# Patient Record
Sex: Male | Born: 1980 | Race: Black or African American | Hispanic: No | Marital: Single | Smoking: Current every day smoker
Health system: Southern US, Community
[De-identification: ages and names within clinical notes are randomized; demographics above are authoritative.]

## PROBLEM LIST (undated history)

## (undated) DIAGNOSIS — T148XXA Other injury of unspecified body region, initial encounter: Secondary | ICD-10-CM

## (undated) DIAGNOSIS — I639 Cerebral infarction, unspecified: Secondary | ICD-10-CM

## (undated) HISTORY — PX: SP PERC PLACE GASTRIC TUBE: HXRAD333

---

## 2012-01-19 ENCOUNTER — Encounter (HOSPITAL_BASED_OUTPATIENT_CLINIC_OR_DEPARTMENT_OTHER): Payer: Self-pay | Admitting: Family Medicine

## 2012-01-19 ENCOUNTER — Emergency Department (HOSPITAL_BASED_OUTPATIENT_CLINIC_OR_DEPARTMENT_OTHER)
Admission: EM | Admit: 2012-01-19 | Discharge: 2012-01-19 | Disposition: A | Discharge status: Home / Self Care | Payer: Self-pay | Attending: Emergency Medicine | Admitting: Emergency Medicine

## 2012-01-19 DIAGNOSIS — L309 Dermatitis, unspecified: Secondary | ICD-10-CM

## 2012-01-19 DIAGNOSIS — L259 Unspecified contact dermatitis, unspecified cause: Secondary | ICD-10-CM | POA: Insufficient documentation

## 2012-01-19 DIAGNOSIS — F17200 Nicotine dependence, unspecified, uncomplicated: Secondary | ICD-10-CM | POA: Insufficient documentation

## 2012-01-19 MED ORDER — DIPHENHYDRAMINE HCL 25 MG PO CAPS
50.0000 mg | ORAL_CAPSULE | Freq: Once | ORAL | Status: AC
  Administered 2012-01-19: 50 mg via ORAL
  Filled 2012-01-19: qty 1

## 2012-01-19 MED ORDER — DIPHENHYDRAMINE HCL 25 MG PO CAPS: 25.0000 mg | ORAL_CAPSULE | Freq: Four times a day (QID) | ORAL | Status: DC | PRN

## 2012-01-19 MED ORDER — PREDNISONE 50 MG PO TABS: 50.0000 mg | ORAL_TABLET | Freq: Every day | ORAL | Status: DC

## 2012-01-19 MED ORDER — PREDNISONE 50 MG PO TABS
60.0000 mg | ORAL_TABLET | Freq: Once | ORAL | Status: AC
  Administered 2012-01-19: 60 mg via ORAL
  Filled 2012-01-19: qty 1

## 2012-01-19 MED ORDER — DIPHENHYDRAMINE HCL 25 MG PO CAPS
ORAL_CAPSULE | ORAL | Status: AC
  Filled 2012-01-19: qty 1

## 2012-01-19 MED ORDER — CARRINGTON MOISTURE BARRIER EX CREA: TOPICAL_CREAM | CUTANEOUS | Status: DC | PRN

## 2012-01-19 NOTE — ED Notes (Signed)
As patient was getting discharged he states "I think I pulled a muscle". MD aware. Pt informed to take OTC Ibuprofen, anti inflammatory for the discomfort and follow up with his MD if no better in a few days.

## 2012-01-19 NOTE — ED Provider Notes (Signed)
History     CSN: [id number]  Arrival date & time 01/19/12  4098   First MD Initiated Contact with Patient 01/19/12 1019      Chief Complaint  Patient presents with  . Rash    (Consider location/radiation/quality/duration/timing/severity/associated sxs/prior treatment) HPI Comments: Pt with no medical hx, no allergy hx, no skin disease comes in with cc of rash. Pt has had rash in his bilateral lower extremity for 1 month now. The rash is not spreading, but is itchy - at all times. No bleeding from the site. No new exposures to environmental allergens, or chemicals.   Patient is a 31 y.o. male presenting with rash. The history is provided by the patient.  Rash     History reviewed. No pertinent past medical history.  History reviewed. No pertinent past surgical history.  No family history on file.  History  Substance Use Topics  . Smoking status: Current Every Day Smoker  . Smokeless tobacco: Not on file  . Alcohol Use: Yes      Review of Systems  Constitutional: Negative for activity change and appetite change.  Respiratory: Negative for cough and shortness of breath.   Cardiovascular: Negative for chest pain.  Gastrointestinal: Negative for abdominal pain.  Genitourinary: Negative for dysuria.  Skin: Positive for rash.    Allergies  Review of patient's allergies indicates no known allergies.  Home Medications  No current outpatient prescriptions on file.  BP 102/71  Pulse 68  Temp 97.9 F (36.6 C) (Oral)  Resp 14  Ht 5\' 11"  (1.803 m)  Wt 150 lb (68.04 kg)  BMI 20.92 kg/m2  SpO2 100%  Physical Exam  Nursing note and vitals reviewed. Constitutional: He is oriented to person, place, and time. He appears well-developed.  HENT:  Head: Normocephalic and atraumatic.  Eyes: Conjunctivae normal and EOM are normal. Pupils are equal, round, and reactive to light.  Neck: Normal range of motion. Neck supple.  Cardiovascular: Normal rate and regular rhythm.    Pulmonary/Chest: Effort normal and breath sounds normal.  Abdominal: Soft. Bowel sounds are normal. He exhibits no distension. There is no tenderness. There is no rebound and no guarding.  Neurological: He is alert and oriented to person, place, and time.  Skin: Skin is warm. Rash noted.       Pt has hyperpigmented, scaly plaques diffusely in the lower extremity, worst over the right thigh region. There is no erythema, no fluctuance, no tenderness.     ED Course  Procedures (including critical care time)  Labs Reviewed - No data to display No results found.   No diagnosis found.    MDM  Pt comes in with cc of rash. The exam is not consistent with a specific etiology. No signs of infection. No family hx or personal hx of autoimmune disease or skin disease. Will treat the symptoms, and request pcp follow up.  Derwood Kaplan, MD 01/19/12 1110

## 2012-01-19 NOTE — ED Notes (Signed)
Pt c/o "rash" to right upper leg x "about a month" and itches.

## 2012-07-18 ENCOUNTER — Encounter (HOSPITAL_BASED_OUTPATIENT_CLINIC_OR_DEPARTMENT_OTHER): Payer: Self-pay | Admitting: Emergency Medicine

## 2012-07-18 ENCOUNTER — Emergency Department (HOSPITAL_BASED_OUTPATIENT_CLINIC_OR_DEPARTMENT_OTHER)
Admission: EM | Admit: 2012-07-18 | Discharge: 2012-07-18 | Disposition: A | Discharge status: Home / Self Care | Payer: BC Managed Care – PPO | Attending: Emergency Medicine | Admitting: Emergency Medicine

## 2012-07-18 DIAGNOSIS — L0291 Cutaneous abscess, unspecified: Secondary | ICD-10-CM

## 2012-07-18 DIAGNOSIS — L0231 Cutaneous abscess of buttock: Secondary | ICD-10-CM | POA: Insufficient documentation

## 2012-07-18 DIAGNOSIS — F17200 Nicotine dependence, unspecified, uncomplicated: Secondary | ICD-10-CM | POA: Insufficient documentation

## 2012-07-18 MED ORDER — LIDOCAINE HCL 2 % IJ SOLN
10.0000 mL | Freq: Once | INTRAMUSCULAR | Status: AC
  Administered 2012-07-18: 200 mg
  Filled 2012-07-18: qty 20

## 2012-07-18 MED ORDER — SULFAMETHOXAZOLE-TRIMETHOPRIM 800-160 MG PO TABS: 2.0000 | ORAL_TABLET | Freq: Two times a day (BID) | ORAL | Status: DC

## 2012-07-18 MED ORDER — HYDROCODONE-ACETAMINOPHEN 5-325 MG PO TABS
2.0000 | ORAL_TABLET | ORAL | Status: DC | PRN
Start: 2012-07-18 — End: 2013-12-09

## 2012-07-18 NOTE — ED Notes (Signed)
Pt has abscess/boil to left buttock x 2 days.  Unable to sit down.  No known fever.

## 2012-07-18 NOTE — ED Provider Notes (Signed)
History     CSN: [id number]  Arrival date & time 07/18/12  2000   First MD Initiated Contact with Patient 07/18/12 2014      Chief Complaint  Patient presents with  . Abscess    (Consider location/radiation/quality/duration/timing/severity/associated sxs/prior treatment) HPI Comments: Patient goes to [place] for evaluation of an abscess on his backside. Patient reports that it started as a Volner bump on his left buttock, over the last 2 days it has progressively worsened. Pain is now unbearable. He can't sit because of the pain. Patient also has an area on his right upper buttock that has been there for longer period of time. It has started draining.  Patient is a 32 y.o. male presenting with abscess.  Abscess Associated symptoms: no fever     No past medical history on file.  No past surgical history on file.  No family history on file.  History  Substance Use Topics  . Smoking status: Current Every Day Smoker  . Smokeless tobacco: Not on file  . Alcohol Use: Yes      Review of Systems  Constitutional: Negative for fever.  Skin: Positive for wound.    Allergies  Review of patient's allergies indicates no known allergies.  Home Medications  No current outpatient prescriptions on file.  BP 130/76  Pulse 103  Temp(Src) 99.8 F (37.7 C) (Oral)  Resp 16  Ht 6' (1.829 m)  Wt 147 lb (66.679 kg)  BMI 19.93 kg/m2  SpO2 100%  Physical Exam  Constitutional: He appears well-developed.  HENT:  Head: Normocephalic.  Eyes: Pupils are equal, round, and reactive to light.  Cardiovascular: Normal rate.   Pulmonary/Chest: Effort normal and breath sounds normal.  Musculoskeletal: Normal range of motion. He exhibits no edema.  Skin:       ED Course  INCISION AND DRAINAGE Date/Time: 07/18/2012 9:03 PM Performed by: Gilda Crease. Authorized by: Gilda Crease Consent: Verbal consent obtained. Risks and benefits: risks, benefits and alternatives were  discussed Consent given by: patient Patient understanding: patient states understanding of the procedure being performed Type: abscess Body area: anogenital Location details: gluteal cleft Anesthesia: local infiltration Local anesthetic: lidocaine 2% without epinephrine Anesthetic total: 5 ml Patient sedated: no Scalpel size: 11 Incision type: single straight Complexity: simple Drainage: purulent Drainage amount: copious Packing material: 1/4 in iodoform gauze Patient tolerance: Patient tolerated the procedure well with no immediate complications.   (including critical care time)    Labs Reviewed - No data to display No results found.   Diagnosis: Skin abscess    MDM  Patient has a spontaneously draining abscess in his right upper gluteal region which did not require any intervention. He had a second abscess, however, at the gluteal cleft on the left buttock region. This required incision and drainage. Procedure was performed without difficulty. Packing left in place. Patient will return in 2 days for packing removal and repeat evaluation. The patient was treated with Vicodin and Bactrim.        Gilda Crease, MD 07/18/12 2105

## 2012-07-20 ENCOUNTER — Emergency Department (HOSPITAL_BASED_OUTPATIENT_CLINIC_OR_DEPARTMENT_OTHER)
Admission: EM | Admit: 2012-07-20 | Discharge: 2012-07-20 | Disposition: A | Discharge status: Home / Self Care | Payer: BC Managed Care – PPO | Attending: Emergency Medicine | Admitting: Emergency Medicine

## 2012-07-20 ENCOUNTER — Encounter (HOSPITAL_BASED_OUTPATIENT_CLINIC_OR_DEPARTMENT_OTHER): Payer: Self-pay | Admitting: *Deleted

## 2012-07-20 DIAGNOSIS — IMO0001 Reserved for inherently not codable concepts without codable children: Secondary | ICD-10-CM | POA: Insufficient documentation

## 2012-07-20 DIAGNOSIS — F17200 Nicotine dependence, unspecified, uncomplicated: Secondary | ICD-10-CM | POA: Insufficient documentation

## 2012-07-20 DIAGNOSIS — Z4801 Encounter for change or removal of surgical wound dressing: Secondary | ICD-10-CM | POA: Insufficient documentation

## 2012-07-20 MED ORDER — SULFAMETHOXAZOLE-TRIMETHOPRIM 800-160 MG PO TABS: 1.0000 | ORAL_TABLET | Freq: Two times a day (BID) | ORAL | Status: DC

## 2012-07-20 NOTE — ED Provider Notes (Signed)
History     CSN: [id number]  Arrival date & time 07/20/12  1216   First MD Initiated Contact with Patient 07/20/12 1321      Chief Complaint  Patient presents with  . Wound Check    (Consider location/radiation/quality/duration/timing/severity/associated sxs/prior treatment) Patient is a 32 y.o. male presenting with wound check. The history is provided by the patient. No language interpreter was used.  Wound Check This is a new problem. The current episode started today. The problem occurs constantly. The problem has been gradually worsening. Nothing aggravates the symptoms. He has tried nothing for the symptoms. The treatment provided moderate relief.    History reviewed. No pertinent past medical history.  History reviewed. No pertinent past surgical history.  No family history on file.  History  Substance Use Topics  . Smoking status: Current Every Day Smoker  . Smokeless tobacco: Not on file  . Alcohol Use: Yes      Review of Systems  Skin: Positive for wound.  All other systems reviewed and are negative.    Allergies  Review of patient's allergies indicates no known allergies.  Home Medications   Current Outpatient Rx  Name  Route  Sig  Dispense  Refill  . HYDROcodone-acetaminophen (NORCO/VICODIN) 5-325 MG per tablet   Oral   Take 2 tablets by mouth every 4 (four) hours as needed for pain.   10 tablet   0   . sulfamethoxazole-trimethoprim (SEPTRA DS) 800-160 MG per tablet   Oral   Take 1 tablet by mouth 2 (two) times daily.   20 tablet   0     BP 122/74  Pulse 75  Temp(Src) 98.9 F (37.2 C) (Oral)  Resp 20  Ht 6' (1.829 m)  Wt 140 lb (63.504 kg)  BMI 18.98 kg/m2  SpO2 100%  Physical Exam  Vitals reviewed. Constitutional: He is oriented to person, place, and time. He appears well-developed and well-nourished.  Musculoskeletal: He exhibits tenderness.  Packing removed  Neurological: He is alert and oriented to person, place, and time.   Skin: Skin is warm.  Psychiatric: He has a normal mood and affect.    ED Course  Procedures (including critical care time)  Labs Reviewed - No data to display No results found.   1. Wound abscess, subsequent encounter       MDM  Pt has not filled RX.   Pt advised to take antibiotic,   Packing removed        Elson Areas, PA-C 07/20/12 [address] [place], [place] 07/20/12 1402

## 2012-07-20 NOTE — ED Provider Notes (Signed)
Medical screening examination/treatment/procedure(s) were performed by non-physician practitioner and as supervising physician I was immediately available for consultation/collaboration.   Charles B. Sheldon, MD 07/20/12 1446 
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
## 2013-12-09 ENCOUNTER — Encounter (HOSPITAL_BASED_OUTPATIENT_CLINIC_OR_DEPARTMENT_OTHER): Payer: Self-pay | Admitting: *Deleted

## 2013-12-09 ENCOUNTER — Emergency Department (HOSPITAL_BASED_OUTPATIENT_CLINIC_OR_DEPARTMENT_OTHER)
Admission: EM | Admit: 2013-12-09 | Discharge: 2013-12-09 | Disposition: A | Discharge status: Home / Self Care | Payer: BC Managed Care – PPO | Attending: Emergency Medicine | Admitting: Emergency Medicine

## 2013-12-09 DIAGNOSIS — G8929 Other chronic pain: Secondary | ICD-10-CM | POA: Diagnosis not present

## 2013-12-09 DIAGNOSIS — Z87828 Personal history of other (healed) physical injury and trauma: Secondary | ICD-10-CM | POA: Diagnosis not present

## 2013-12-09 DIAGNOSIS — Z720 Tobacco use: Secondary | ICD-10-CM | POA: Diagnosis not present

## 2013-12-09 DIAGNOSIS — Z792 Long term (current) use of antibiotics: Secondary | ICD-10-CM | POA: Insufficient documentation

## 2013-12-09 DIAGNOSIS — Z8673 Personal history of transient ischemic attack (TIA), and cerebral infarction without residual deficits: Secondary | ICD-10-CM | POA: Diagnosis not present

## 2013-12-09 DIAGNOSIS — M545 Low back pain, unspecified: Secondary | ICD-10-CM

## 2013-12-09 DIAGNOSIS — Z993 Dependence on wheelchair: Secondary | ICD-10-CM | POA: Diagnosis not present

## 2013-12-09 HISTORY — DX: Other injury of unspecified body region, initial encounter: T14.8XXA

## 2013-12-09 HISTORY — DX: Cerebral infarction, unspecified: I63.9

## 2013-12-09 MED ORDER — OXYCODONE-ACETAMINOPHEN 5-325 MG PO TABS: 1.0000 | ORAL_TABLET | ORAL | Status: DC | PRN
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
## 2013-12-09 NOTE — Discharge Instructions (Signed)
As discussed you need to follow up with Pain management if you continue to have chronic pain in your back Chronic Back Pain  When back pain lasts longer than 3 months, it is called chronic back pain.People with chronic back pain often go through certain periods that are more intense (flare-ups).  CAUSES Chronic back pain can be caused by wear and tear (degeneration) on different structures in your back. These structures include:  The bones of your spine (vertebrae) and the joints surrounding your spinal cord and nerve roots (facets).  The strong, fibrous tissues that connect your vertebrae (ligaments). Degeneration of these structures may result in pressure on your nerves. This can lead to constant pain. HOME CARE INSTRUCTIONS  Avoid bending, heavy lifting, prolonged sitting, and activities which make the problem worse.  Take brief periods of rest throughout the day to reduce your pain. Lying down or standing usually is better than sitting while you are resting.  Take over-the-counter or prescription medicines only as directed by your caregiver. SEEK IMMEDIATE MEDICAL CARE IF:   You have weakness or numbness in one of your legs or feet.  You have trouble controlling your bladder or bowels.  You have nausea, vomiting, abdominal pain, shortness of breath, or fainting. Document Released: 02/25/2004 Document Revised: 04/11/2011 Document Reviewed: 01/01/2011 [facility] Patient Information 2015 [place], [place]. This information is not intended to replace advice given to you by your health care provider. Make sure you discuss any questions you have with your health care provider.
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
## 2018-11-06 ENCOUNTER — Emergency Department (HOSPITAL_BASED_OUTPATIENT_CLINIC_OR_DEPARTMENT_OTHER): Payer: Medicaid Other

## 2018-11-06 ENCOUNTER — Other Ambulatory Visit: Payer: Self-pay

## 2018-11-06 ENCOUNTER — Encounter (HOSPITAL_BASED_OUTPATIENT_CLINIC_OR_DEPARTMENT_OTHER): Payer: Self-pay | Admitting: Emergency Medicine

## 2018-11-06 ENCOUNTER — Emergency Department (HOSPITAL_BASED_OUTPATIENT_CLINIC_OR_DEPARTMENT_OTHER)
Admission: EM | Admit: 2018-11-06 | Discharge: 2018-11-06 | Disposition: A | Discharge status: Home / Self Care | Payer: Medicaid Other | Attending: Emergency Medicine | Admitting: Emergency Medicine

## 2018-11-06 DIAGNOSIS — F17210 Nicotine dependence, cigarettes, uncomplicated: Secondary | ICD-10-CM | POA: Diagnosis not present

## 2018-11-06 DIAGNOSIS — R0981 Nasal congestion: Secondary | ICD-10-CM | POA: Insufficient documentation

## 2018-11-06 DIAGNOSIS — R519 Headache, unspecified: Secondary | ICD-10-CM | POA: Diagnosis present

## 2018-11-06 MED ORDER — IBUPROFEN 800 MG PO TABS: 800.0000 mg | ORAL_TABLET | Freq: Three times a day (TID) | ORAL | 0 refills | Status: AC

## 2018-11-06 MED ORDER — FLUTICASONE PROPIONATE 50 MCG/ACT NA SUSP: 2.0000 | Freq: Every day | NASAL | 1 refills | Status: AC

## 2018-11-06 NOTE — ED Notes (Signed)
Patient transported to CT 

## 2018-11-06 NOTE — ED Notes (Signed)
Pt denies pain- reports "pressure" is 7/10.

## 2018-11-06 NOTE — ED Triage Notes (Signed)
Generalized head pressure x 3 days. Denies pain.
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
## 2018-11-06 NOTE — Discharge Instructions (Addendum)
Follow-up with [facility] if the symptoms continue to persist in 1 to 2 weeks.  Use Flonase as we discussed.  Also encourage you to use prescribed ibuprofen instead of the Goody powders.  Return to the ER C medical attention if you develop any new or worsening neurologic deficits, chest pain, worsening of his life, shortness of breath, or other new or worsening symptoms.

## 2020-05-05 ENCOUNTER — Ambulatory Visit (INDEPENDENT_AMBULATORY_CARE_PROVIDER_SITE_OTHER): Payer: Medicaid Other | Admitting: Primary Care

## 2020-07-20 ENCOUNTER — Ambulatory Visit (INDEPENDENT_AMBULATORY_CARE_PROVIDER_SITE_OTHER): Payer: Medicaid Other | Admitting: Primary Care

## 2020-09-28 ENCOUNTER — Ambulatory Visit (INDEPENDENT_AMBULATORY_CARE_PROVIDER_SITE_OTHER): Payer: Medicaid Other | Admitting: Primary Care

## 2021-04-16 ENCOUNTER — Encounter (HOSPITAL_COMMUNITY): Payer: Self-pay

## 2021-04-16 ENCOUNTER — Other Ambulatory Visit: Payer: Self-pay

## 2021-04-16 ENCOUNTER — Emergency Department (HOSPITAL_COMMUNITY)
Admission: EM | Admit: 2021-04-16 | Discharge: 2021-04-16 | Disposition: A | Discharge status: Home / Self Care | Payer: Medicaid Other | Attending: Physician Assistant | Admitting: Physician Assistant

## 2021-04-16 ENCOUNTER — Emergency Department (HOSPITAL_COMMUNITY): Payer: Medicaid Other

## 2021-04-16 DIAGNOSIS — R519 Headache, unspecified: Secondary | ICD-10-CM | POA: Diagnosis present

## 2021-04-16 DIAGNOSIS — Z5321 Procedure and treatment not carried out due to patient leaving prior to being seen by health care provider: Secondary | ICD-10-CM | POA: Diagnosis not present

## 2021-04-16 LAB — CBC WITH DIFFERENTIAL/PLATELET
Abs Immature Granulocytes: 0.01 10*3/uL (ref 0.00–0.07)
Basophils Absolute: 0.1 10*3/uL (ref 0.0–0.1)
Basophils Relative: 1 %
Eosinophils Absolute: 0 10*3/uL (ref 0.0–0.5)
Eosinophils Relative: 1 %
HCT: 36.4 % — ABNORMAL LOW (ref 39.0–52.0)
Hemoglobin: 12.3 g/dL — ABNORMAL LOW (ref 13.0–17.0)
Immature Granulocytes: 0 %
Lymphocytes Relative: 39 %
Lymphs Abs: 1.7 10*3/uL (ref 0.7–4.0)
MCH: 25.4 pg — ABNORMAL LOW (ref 26.0–34.0)
MCHC: 33.8 g/dL (ref 30.0–36.0)
MCV: 75.2 fL — ABNORMAL LOW (ref 80.0–100.0)
Monocytes Absolute: 0.7 10*3/uL (ref 0.1–1.0)
Monocytes Relative: 15 %
Neutro Abs: 1.9 10*3/uL (ref 1.7–7.7)
Neutrophils Relative %: 44 %
Platelets: 183 10*3/uL (ref 150–400)
RBC: 4.84 MIL/uL (ref 4.22–5.81)
RDW: 13.8 % (ref 11.5–15.5)
WBC: 4.4 10*3/uL (ref 4.0–10.5)
nRBC: 0 % (ref 0.0–0.2)

## 2021-04-16 LAB — COMPREHENSIVE METABOLIC PANEL
ALT: 64 U/L — ABNORMAL HIGH (ref 0–44)
AST: 112 U/L — ABNORMAL HIGH (ref 15–41)
Albumin: 4.4 g/dL (ref 3.5–5.0)
Alkaline Phosphatase: 56 U/L (ref 38–126)
Anion gap: 12 (ref 5–15)
BUN: <5 mg/dL — ABNORMAL LOW (ref 6–20)
CO2: 26 mmol/L (ref 22–32)
Calcium: 9 mg/dL (ref 8.9–10.3)
Chloride: 95 mmol/L — ABNORMAL LOW (ref 98–111)
Creatinine, Ser: 0.7 mg/dL (ref 0.61–1.24)
GFR, Estimated: >60 mL/min (ref >60)
Glucose, Bld: 85 mg/dL (ref 70–99)
Potassium: 3.9 mmol/L (ref 3.5–5.1)
Sodium: 133 mmol/L — ABNORMAL LOW (ref 135–145)
Total Bilirubin: 0.6 mg/dL (ref 0.3–1.2)
Total Protein: 7.5 g/dL (ref 6.5–8.1)
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
## 2021-04-16 NOTE — ED Notes (Signed)
Pt refused vitals and states that he is about to leave and go to his PCP on Monday  ?

## 2021-04-16 NOTE — ED Notes (Signed)
Pt was seen leaving the ED.  ?

## 2021-04-17 ENCOUNTER — Ambulatory Visit (HOSPITAL_COMMUNITY)
Admission: EM | Admit: 2021-04-17 | Discharge: 2021-04-17 | Disposition: A | Discharge status: Home / Self Care | Payer: Medicaid Other | Attending: Urgent Care | Admitting: Urgent Care

## 2021-04-17 ENCOUNTER — Encounter (HOSPITAL_COMMUNITY): Payer: Self-pay | Admitting: *Deleted

## 2021-04-17 ENCOUNTER — Other Ambulatory Visit: Payer: Self-pay

## 2021-04-17 DIAGNOSIS — G44221 Chronic tension-type headache, intractable: Secondary | ICD-10-CM | POA: Diagnosis not present

## 2021-04-17 DIAGNOSIS — D649 Anemia, unspecified: Secondary | ICD-10-CM | POA: Diagnosis not present

## 2021-04-17 DIAGNOSIS — R7989 Other specified abnormal findings of blood chemistry: Secondary | ICD-10-CM | POA: Diagnosis not present

## 2021-04-17 DIAGNOSIS — Z8673 Personal history of transient ischemic attack (TIA), and cerebral infarction without residual deficits: Secondary | ICD-10-CM

## 2021-04-17 MED ORDER — CYCLOBENZAPRINE HCL 10 MG PO TABS: 10.0000 mg | ORAL_TABLET | Freq: Two times a day (BID) | ORAL | 0 refills | Status: AC | PRN

## 2021-04-17 MED ORDER — BUTALBITAL-APAP-CAFFEINE 50-325-40 MG PO TABS: 1.0000 | ORAL_TABLET | Freq: Four times a day (QID) | ORAL | 0 refills | Status: AC | PRN
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
## 2021-04-17 NOTE — ED Triage Notes (Signed)
Pt reports he has had a tension HA for 11 months. Pt  reports no one can find out what it is.Pt reports his head getts tight some times. ?

## 2021-04-17 NOTE — Discharge Instructions (Addendum)
Please call your PCP and schedule a follow up / workup. You have elevated liver function tests and anemia, both of which can indicate liver damage. I would recommend a workup for hepatitis, particularly Hepatitis C as headache can be seen in this condition. ?Please CUT BACK on alcohol intake, as this can also lead to headache, anemia and liver issues. Schedule follow up with PCP. ?We are treating you today for a tension headache. Please do not exceed 4 tabs of fioricet in a week. If needed, you can take cyclobenzaprine in place of it, however this medication might make you drowsy. ? ?

## 2022-05-05 ENCOUNTER — Ambulatory Visit (HOSPITAL_COMMUNITY)
Admission: EM | Admit: 2022-05-05 | Discharge: 2022-05-05 | Disposition: A | Discharge status: Home / Self Care | Payer: Medicaid Other | Attending: Internal Medicine | Admitting: Internal Medicine

## 2022-05-05 ENCOUNTER — Encounter (HOSPITAL_COMMUNITY): Payer: Self-pay | Admitting: Emergency Medicine

## 2022-05-05 ENCOUNTER — Telehealth (HOSPITAL_COMMUNITY): Payer: Self-pay | Admitting: Emergency Medicine

## 2022-05-05 DIAGNOSIS — K1379 Other lesions of oral mucosa: Secondary | ICD-10-CM | POA: Diagnosis not present

## 2022-05-05 DIAGNOSIS — R6884 Jaw pain: Secondary | ICD-10-CM

## 2022-05-05 MED ORDER — AMOXICILLIN-POT CLAVULANATE 875-125 MG PO TABS: 1.0000 | ORAL_TABLET | Freq: Two times a day (BID) | ORAL | 0 refills | Status: DC

## 2022-05-05 MED ORDER — LIDOCAINE VISCOUS HCL 2 % MT SOLN: 15.0000 mL | OROMUCOSAL | 0 refills | Status: AC | PRN

## 2022-05-05 MED ORDER — LIDOCAINE VISCOUS HCL 2 % MT SOLN: 15.0000 mL | OROMUCOSAL | 0 refills | Status: DC | PRN

## 2022-05-05 MED ORDER — PHENOL 0.5 % MT LIQD: OROMUCOSAL | 0 refills | Status: DC

## 2022-05-05 MED ORDER — PHENOL 0.5 % MT LIQD: OROMUCOSAL | 0 refills | Status: AC

## 2022-05-05 MED ORDER — AMOXICILLIN-POT CLAVULANATE 875-125 MG PO TABS: 1.0000 | ORAL_TABLET | Freq: Two times a day (BID) | ORAL | 0 refills | Status: AC

## 2022-05-05 NOTE — ED Triage Notes (Signed)
Pt reports oral pain. States he has pain around his gums. One area affected is in the front and the other is on the right side of jaw. Has tried a mouth rinse with no relief.

## 2022-05-05 NOTE — Discharge Instructions (Signed)
Please continue using oral antiseptic mouthwash Apply lidocaine to the areas involved Please take vitamin C over-the-counter tablets once daily Complete the course of antibiotics Return to urgent care if you have any other concerns.
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
## 2022-07-31 IMAGING — CT CT HEAD W/O CM
4 series · 17 of 47 positions shown, 19 images · non-contrast
Comparison: CT head dated November 06, 2018.

CLINICAL DATA: Chronic headaches.



[Series 3: head without · axial · non-contrast · 0.43mm/px · z∈[+1138,+1258]mm · 7 of 33 slices shown, 9 images]
[im 5/33  brain]
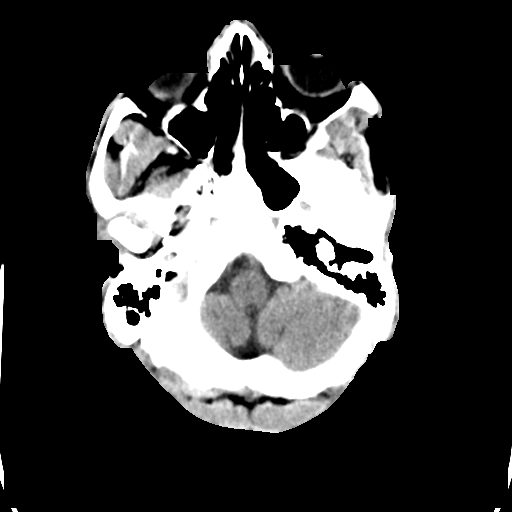
[im 5/33  bone]
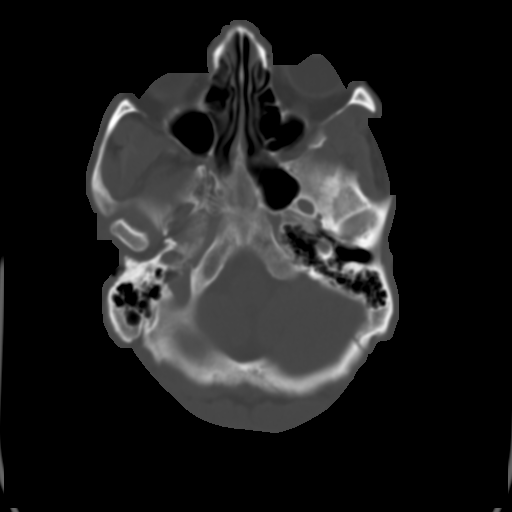
[im 9/33  brain]
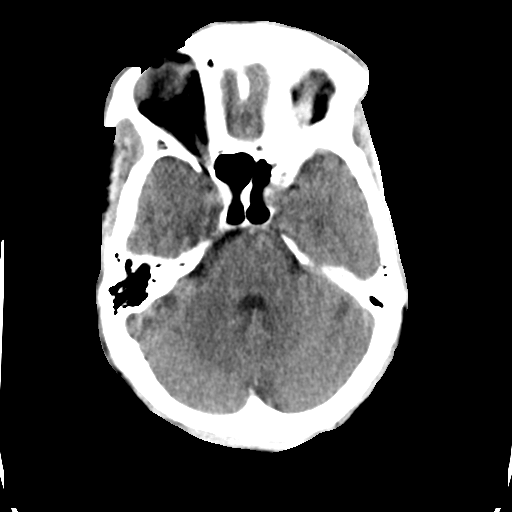
[im 13/33  brain]
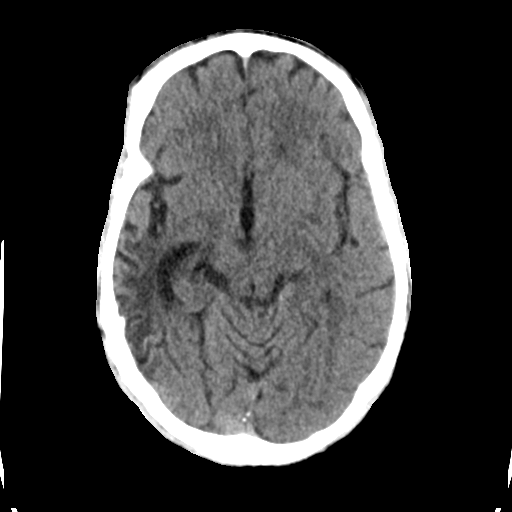
[im 17/33  brain]
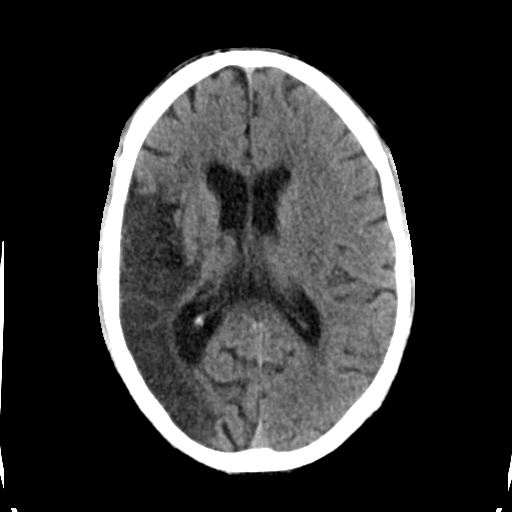
[im 21/33  brain]
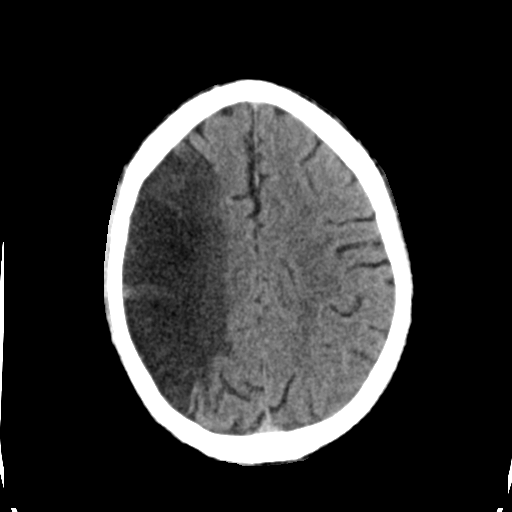
[im 21/33  bone]
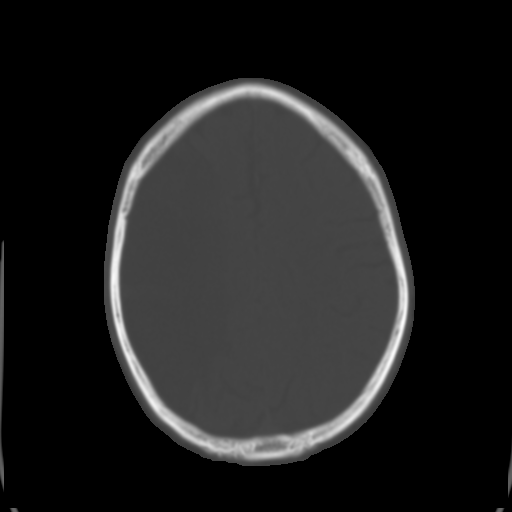
[im 25/33  brain]
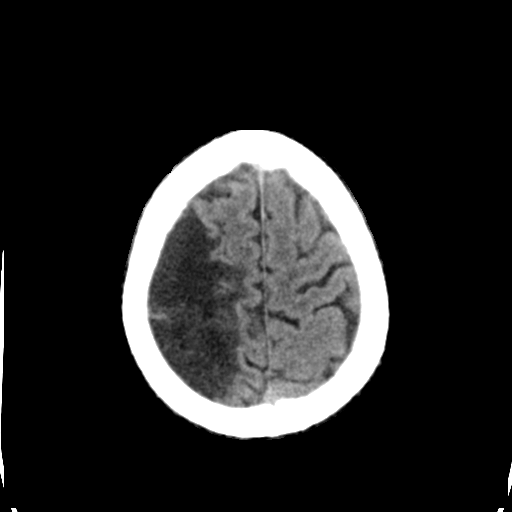
[im 29/33  brain]
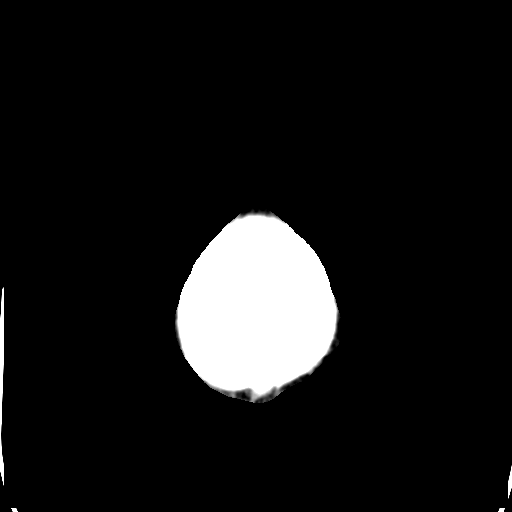

[Series 4: head bone · axial · 0.43mm/px · z∈[+1134,+1190]mm · 4 of 81 slices shown]
[im 9/81  bone]
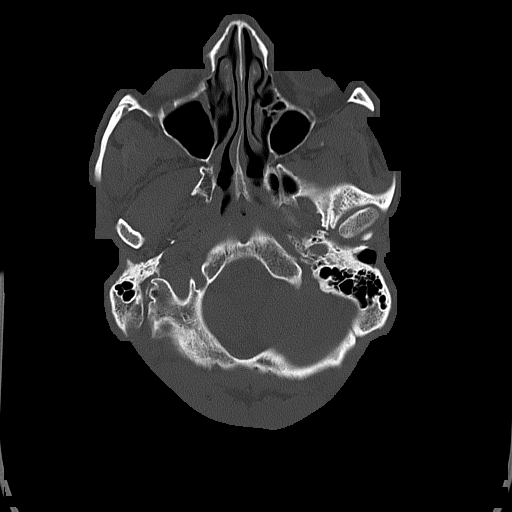
[im 17/81  bone]
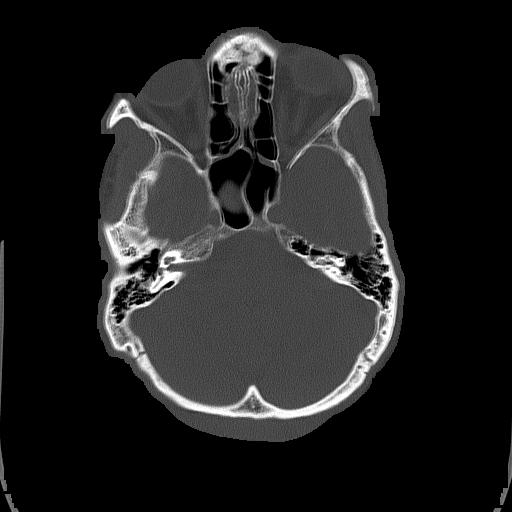
[im 25/81  bone]
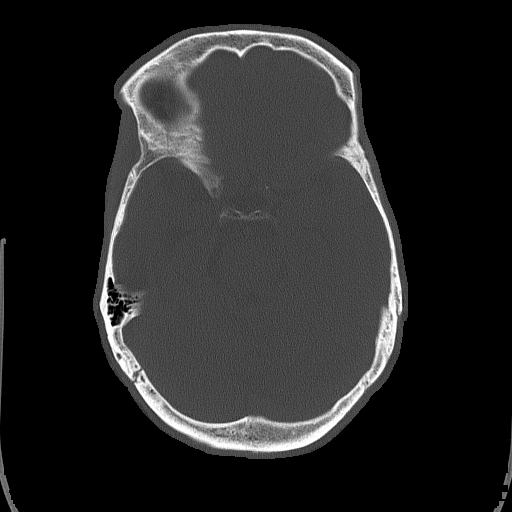
[im 37/81  bone]
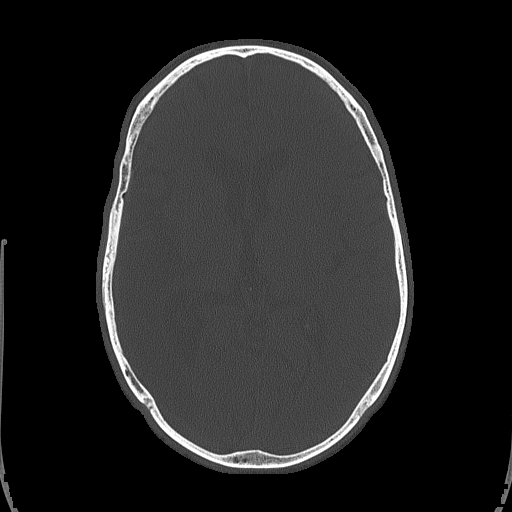

[Series 5: head without cor · coronal · non-contrast · 0.34mm/px · 3 of 67 slices shown]
[im 23/67  brain]
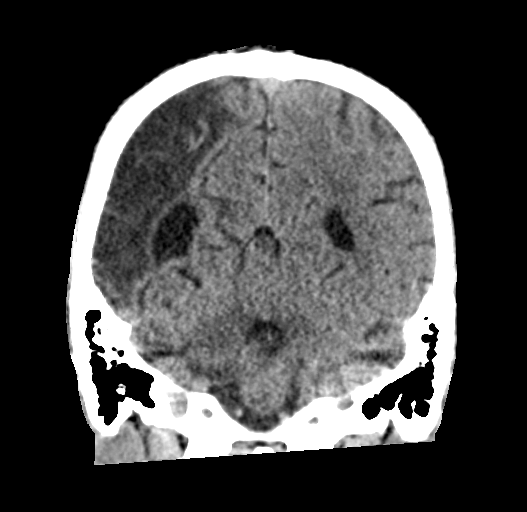
[im 30/67  brain]
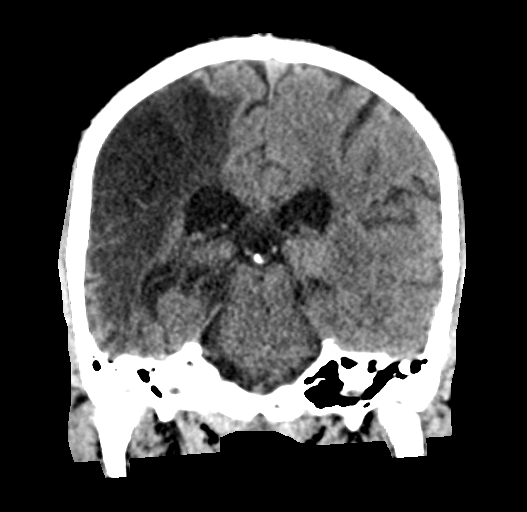
[im 37/67  brain]
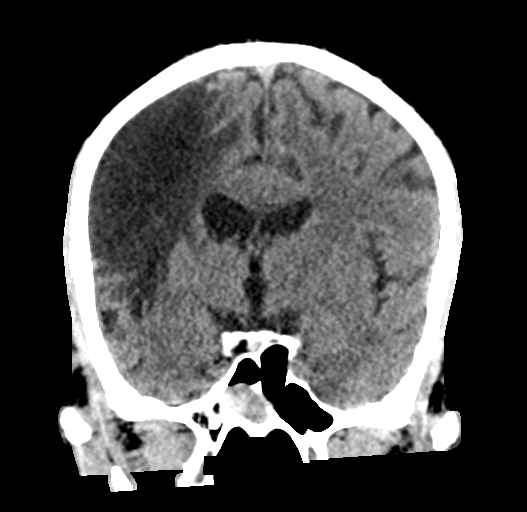

[Series 6: head without sag · sagittal · non-contrast · 0.34mm/px · 3 of 67 slices shown]
[im 23/67  brain]
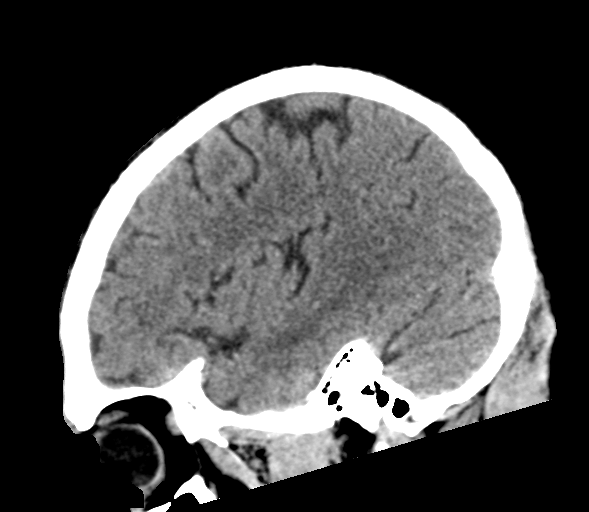
[im 34/67  brain]
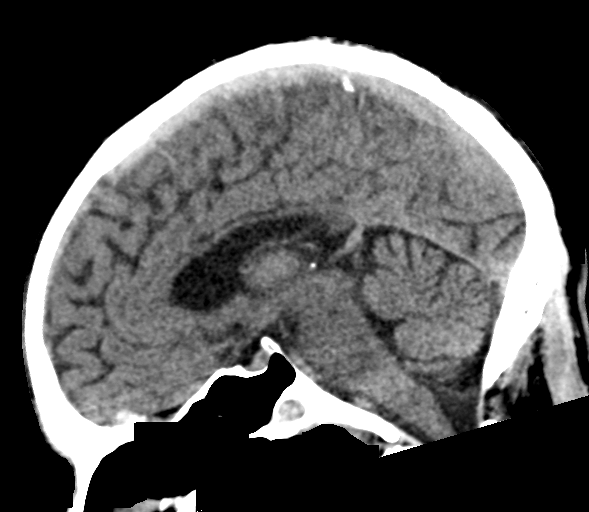
[im 45/67  brain]
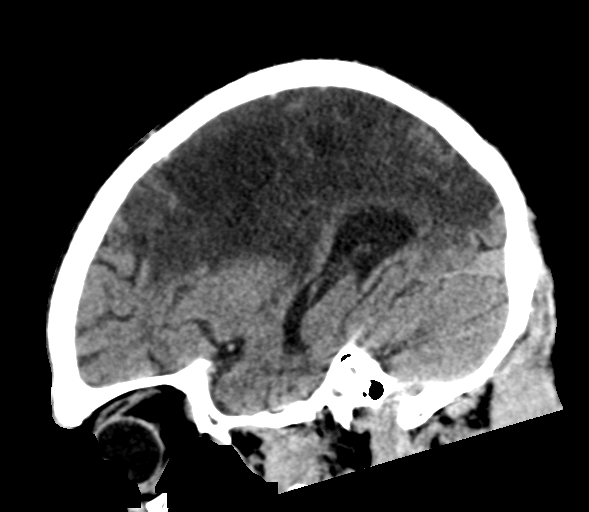

[17 of 47 positions shown; findings below may reference images not displayed]

FINDINGS: Brain: No evidence of acute infarction, hemorrhage, hydrocephalus,
extra-axial collection or mass lesion/mass effect. Old large right
MCA territory infarct again noted.

Vascular: No hyperdense vessel or unexpected calcification.

Skull: Normal. Negative for fracture or focal lesion.

Sinuses/Orbits: No acute finding. Chronic retention cyst in the
right sphenoid sinus.

Other: None.
IMPRESSION: 1. No acute intracranial abnormality.
2. Unchanged large chronic right MCA territory infarct.

## 2023-07-18 ENCOUNTER — Emergency Department (HOSPITAL_COMMUNITY)
Admission: EM | Admit: 2023-07-18 | Discharge: 2023-07-18 | Disposition: A | Discharge status: Home / Self Care | Attending: Emergency Medicine | Admitting: Emergency Medicine

## 2023-07-18 ENCOUNTER — Other Ambulatory Visit: Payer: Self-pay

## 2023-07-18 DIAGNOSIS — R55 Syncope and collapse: Secondary | ICD-10-CM | POA: Insufficient documentation

## 2023-07-18 DIAGNOSIS — R531 Weakness: Secondary | ICD-10-CM | POA: Insufficient documentation

## 2023-07-18 LAB — CBC
HCT: 32 % — ABNORMAL LOW (ref 39.0–52.0)
Hemoglobin: 10.6 g/dL — ABNORMAL LOW (ref 13.0–17.0)
MCH: 25.7 pg — ABNORMAL LOW (ref 26.0–34.0)
MCHC: 33.1 g/dL (ref 30.0–36.0)
MCV: 77.5 fL — ABNORMAL LOW (ref 80.0–100.0)
Platelets: 161 K/uL (ref 150–400)
RBC: 4.13 MIL/uL — ABNORMAL LOW (ref 4.22–5.81)
RDW: 15.4 % (ref 11.5–15.5)
WBC: 6.3 K/uL (ref 4.0–10.5)
nRBC: 0 % (ref 0.0–0.2)

## 2023-07-18 LAB — COMPREHENSIVE METABOLIC PANEL WITH GFR
ALT: 17 U/L (ref 0–44)
AST: 36 U/L (ref 15–41)
Albumin: 3.6 g/dL (ref 3.5–5.0)
Alkaline Phosphatase: 46 U/L (ref 38–126)
Anion gap: 12 (ref 5–15)
BUN: 5 mg/dL — ABNORMAL LOW (ref 6–20)
CO2: 22 mmol/L (ref 22–32)
Calcium: 8.5 mg/dL — ABNORMAL LOW (ref 8.9–10.3)
Chloride: 104 mmol/L (ref 98–111)
Creatinine, Ser: 0.77 mg/dL (ref 0.61–1.24)
GFR, Estimated: >60 mL/min (ref >60)
Glucose, Bld: 69 mg/dL — ABNORMAL LOW (ref 70–99)
Potassium: 3.6 mmol/L (ref 3.5–5.1)
Sodium: 138 mmol/L (ref 135–145)
Total Bilirubin: 0.8 mg/dL (ref 0.0–1.2)
Total Protein: 6.5 g/dL (ref 6.5–8.1)

## 2023-07-18 NOTE — Discharge Instructions (Signed)
 Your tests showed that you need to eat some food - your blood sugar was a little low - please drink plenty of clear liquids -and make sure that you are following up with your doctor.  Thank you for allowing us  to treat you in the emergency department today.  After reviewing your examination and potential testing that was done it appears that you are safe to go home.  I would like for you to follow-up with your doctor within the next several days, have them obtain your records and follow-up with them to review all potential tests and results from your visit.  If you should develop severe or worsening symptoms return to the emergency department immediately
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
## 2023-07-18 NOTE — ED Triage Notes (Signed)
 PT BIB EMS from home. PT had 2 beers and 1 red juice today and while outside in the heat felt dizzy. Pt sat down and then friends stated pt was out of if for approx 1 minute and had the shakes. Pt does not remember shaking. Pt vomited 1 time. Denies any pain or nausea at this time.   EMS intitall vital signs were 70/40 bp 52 hr 134 cbg 99% ra  18g RAC and was given 700cc LR  Last vitals from EMS  98/64 and 62 HR
